# Patient Record
Sex: Female | Born: 1950 | Race: Black or African American | Hispanic: No | Marital: Married | Smoking: Never smoker
Health system: Southern US, Community
[De-identification: ages and names within clinical notes are randomized; demographics above are authoritative.]

## PROBLEM LIST (undated history)

## (undated) DIAGNOSIS — I10 Essential (primary) hypertension: Secondary | ICD-10-CM

## (undated) DIAGNOSIS — E7800 Pure hypercholesterolemia, unspecified: Secondary | ICD-10-CM

## (undated) HISTORY — PX: APPENDECTOMY: SHX54

## (undated) HISTORY — PX: CHOLECYSTECTOMY: SHX55

## (undated) HISTORY — PX: ABDOMINAL HYSTERECTOMY: SHX81

---

## 1998-09-22 ENCOUNTER — Emergency Department (HOSPITAL_COMMUNITY): Admission: EM | Admit: 1998-09-22 | Discharge: 1998-09-22 | Payer: Self-pay | Admitting: Emergency Medicine

## 1999-10-09 ENCOUNTER — Emergency Department (HOSPITAL_COMMUNITY): Admission: EM | Admit: 1999-10-09 | Discharge: 1999-10-09 | Payer: Self-pay | Admitting: Emergency Medicine

## 2001-06-21 ENCOUNTER — Emergency Department (HOSPITAL_COMMUNITY): Admission: EM | Admit: 2001-06-21 | Discharge: 2001-06-21 | Payer: Self-pay | Admitting: Emergency Medicine

## 2004-03-12 ENCOUNTER — Emergency Department (HOSPITAL_COMMUNITY): Admission: EM | Admit: 2004-03-12 | Discharge: 2004-03-12 | Payer: Self-pay | Admitting: Emergency Medicine

## 2008-05-09 ENCOUNTER — Emergency Department (HOSPITAL_COMMUNITY): Admission: EM | Admit: 2008-05-09 | Discharge: 2008-05-09 | Payer: Self-pay | Admitting: Emergency Medicine

## 2012-01-02 ENCOUNTER — Emergency Department (HOSPITAL_COMMUNITY)
Admission: EM | Admit: 2012-01-02 | Discharge: 2012-01-02 | Disposition: A | Discharge status: Home / Self Care | Payer: No Typology Code available for payment source | Attending: Emergency Medicine | Admitting: Emergency Medicine

## 2012-01-02 ENCOUNTER — Encounter (HOSPITAL_COMMUNITY): Payer: Self-pay | Admitting: *Deleted

## 2012-01-02 DIAGNOSIS — I10 Essential (primary) hypertension: Secondary | ICD-10-CM | POA: Insufficient documentation

## 2012-01-02 DIAGNOSIS — X19XXXA Contact with other heat and hot substances, initial encounter: Secondary | ICD-10-CM | POA: Insufficient documentation

## 2012-01-02 DIAGNOSIS — M25519 Pain in unspecified shoulder: Secondary | ICD-10-CM | POA: Insufficient documentation

## 2012-01-02 DIAGNOSIS — E119 Type 2 diabetes mellitus without complications: Secondary | ICD-10-CM | POA: Insufficient documentation

## 2012-01-02 DIAGNOSIS — M542 Cervicalgia: Secondary | ICD-10-CM | POA: Insufficient documentation

## 2012-01-02 DIAGNOSIS — T2002XA Burn of unspecified degree of lip(s), initial encounter: Secondary | ICD-10-CM | POA: Insufficient documentation

## 2012-01-02 DIAGNOSIS — M25511 Pain in right shoulder: Secondary | ICD-10-CM

## 2012-01-02 HISTORY — DX: Essential (primary) hypertension: I10

## 2012-01-02 HISTORY — DX: Pure hypercholesterolemia, unspecified: E78.00

## 2012-01-02 MED ORDER — BACITRACIN 500 UNIT/GM EX OINT
1.0000 "application " | TOPICAL_OINTMENT | Freq: Two times a day (BID) | CUTANEOUS | Status: DC
  Administered 2012-01-02: 1
  Filled 2012-01-02 (×3): qty 0.9

## 2012-01-02 MED ORDER — IBUPROFEN 800 MG PO TABS: 800.0000 mg | ORAL_TABLET | Freq: Three times a day (TID) | ORAL | Status: AC

## 2012-01-02 MED ORDER — METHOCARBAMOL 500 MG PO TABS: 500.0000 mg | ORAL_TABLET | Freq: Four times a day (QID) | ORAL | Status: AC | PRN
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
## 2012-01-02 NOTE — ED Provider Notes (Signed)
Medical screening examination/treatment/procedure(s) were performed by non-physician practitioner and as supervising physician I was immediately available for consultation/collaboration.  Marilyn Nihiser L Sloan Takagi, MD 01/02/12 2202 
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
## 2014-02-05 ENCOUNTER — Ambulatory Visit: Payer: Self-pay | Admitting: Dietician

## 2015-08-23 ENCOUNTER — Encounter: Payer: Self-pay | Admitting: Internal Medicine

## 2015-10-21 ENCOUNTER — Ambulatory Visit (AMBULATORY_SURGERY_CENTER): Payer: Self-pay

## 2015-10-21 ENCOUNTER — Telehealth: Payer: Self-pay

## 2015-10-21 ENCOUNTER — Encounter: Payer: Self-pay | Admitting: Internal Medicine

## 2015-10-21 ENCOUNTER — Other Ambulatory Visit: Payer: Self-pay

## 2015-10-21 VITALS — Ht 63.5 in | Wt 300.0 lb

## 2015-10-21 DIAGNOSIS — Z1211 Encounter for screening for malignant neoplasm of colon: Secondary | ICD-10-CM

## 2015-10-21 NOTE — Progress Notes (Signed)
No allergies to eggs or soy or flu shot No home oxygen No past problems with anesthesia No diet/weight loss meds  No internet

## 2015-10-21 NOTE — Telephone Encounter (Signed)
BMI too high

## 2015-10-21 NOTE — Telephone Encounter (Signed)
[facility] 11/02/15@10 :45am. Pt aware of appt.

## 2015-10-22 ENCOUNTER — Encounter (HOSPITAL_COMMUNITY): Payer: Self-pay | Admitting: *Deleted

## 2015-11-02 ENCOUNTER — Encounter (HOSPITAL_COMMUNITY)
Admission: RE | Disposition: A | Discharge status: Home / Self Care | Payer: Self-pay | Source: Ambulatory Visit | Attending: Internal Medicine

## 2015-11-02 ENCOUNTER — Encounter (HOSPITAL_COMMUNITY): Payer: Self-pay | Admitting: *Deleted

## 2015-11-02 ENCOUNTER — Ambulatory Visit (HOSPITAL_COMMUNITY): Payer: BC Managed Care – PPO | Admitting: Registered Nurse

## 2015-11-02 ENCOUNTER — Ambulatory Visit (HOSPITAL_COMMUNITY)
Admission: RE | Admit: 2015-11-02 | Discharge: 2015-11-02 | Disposition: A | Discharge status: Home / Self Care | Payer: BC Managed Care – PPO | Source: Ambulatory Visit | Attending: Internal Medicine | Admitting: Internal Medicine

## 2015-11-02 DIAGNOSIS — Z79899 Other long term (current) drug therapy: Secondary | ICD-10-CM | POA: Diagnosis not present

## 2015-11-02 DIAGNOSIS — Z794 Long term (current) use of insulin: Secondary | ICD-10-CM | POA: Insufficient documentation

## 2015-11-02 DIAGNOSIS — E119 Type 2 diabetes mellitus without complications: Secondary | ICD-10-CM | POA: Diagnosis not present

## 2015-11-02 DIAGNOSIS — K5730 Diverticulosis of large intestine without perforation or abscess without bleeding: Secondary | ICD-10-CM | POA: Diagnosis not present

## 2015-11-02 DIAGNOSIS — I10 Essential (primary) hypertension: Secondary | ICD-10-CM | POA: Diagnosis not present

## 2015-11-02 DIAGNOSIS — E669 Obesity, unspecified: Secondary | ICD-10-CM | POA: Diagnosis not present

## 2015-11-02 DIAGNOSIS — Z1211 Encounter for screening for malignant neoplasm of colon: Secondary | ICD-10-CM | POA: Diagnosis present

## 2015-11-02 DIAGNOSIS — E7800 Pure hypercholesterolemia, unspecified: Secondary | ICD-10-CM | POA: Diagnosis not present

## 2015-11-02 DIAGNOSIS — Z6841 Body Mass Index (BMI) 40.0 and over, adult: Secondary | ICD-10-CM | POA: Insufficient documentation

## 2015-11-02 HISTORY — PX: COLONOSCOPY: SHX5424

## 2015-11-02 LAB — GLUCOSE, CAPILLARY: GLUCOSE-CAPILLARY: 119 mg/dL — AB (ref 65–99)

## 2015-11-02 SURGERY — COLONOSCOPY
Anesthesia: Monitor Anesthesia Care

## 2015-11-02 MED ORDER — PROPOFOL 10 MG/ML IV BOLUS
INTRAVENOUS | Status: AC
  Filled 2015-11-02: qty 40

## 2015-11-02 MED ORDER — LIDOCAINE HCL (CARDIAC) 20 MG/ML IV SOLN
INTRAVENOUS | Status: AC
  Filled 2015-11-02: qty 5

## 2015-11-02 MED ORDER — SODIUM CHLORIDE 0.9 % IV SOLN: INTRAVENOUS | Status: DC

## 2015-11-02 MED ORDER — PROPOFOL 500 MG/50ML IV EMUL
INTRAVENOUS | Status: DC | PRN
  Administered 2015-11-02: 100 ug/kg/min via INTRAVENOUS

## 2015-11-02 MED ORDER — LACTATED RINGERS IV SOLN
INTRAVENOUS | Status: DC
  Administered 2015-11-02: 1000 mL via INTRAVENOUS

## 2015-11-02 MED ORDER — PROPOFOL 10 MG/ML IV BOLUS
INTRAVENOUS | Status: AC
  Filled 2015-11-02: qty 20

## 2015-11-02 NOTE — Discharge Instructions (Signed)

## 2015-11-02 NOTE — Op Note (Signed)
[facility] [address] [place], [id number]   COLONOSCOPY PROCEDURE REPORT  PATIENT: Denise Henry, Denise Henry  MR#: [id number] BIRTHDATE: 1951/08/19 , 64  yrs. old GENDER: female ENDOSCOPIST: Beverley FiedlerJay M Shaneta Cervenka, MD REFERRED BY: Selinda FlavinKevin Howard, MD PROCEDURE DATE:  11/02/2015 PROCEDURE:   Colonoscopy, screening First Screening Colonoscopy - Avg.  risk and is 50 yrs.  old or older Yes.  Prior Negative Screening - Now for repeat screening. N/A  History of Adenoma - Now for follow-up colonoscopy & has been > or = to 3 yrs.  N/A  Polyps removed today? No Recommend repeat exam, <10 yrs? No ASA CLASS:   Class III INDICATIONS:Screening for colonic neoplasia and Colorectal Neoplasm Risk Assessment for this procedure is average risk. MEDICATIONS: Monitored anesthesia care and Per Anesthesia  DESCRIPTION OF PROCEDURE:   After the risks benefits and alternatives of the procedure were thoroughly explained, informed consent was obtained.  The digital rectal exam revealed no rectal mass.   The Pentax Ped Colon [id number]  endoscope was introduced through the anus and advanced to the cecum, which was identified by both the appendix and ileocecal valve. No adverse events experienced.   The quality of the prep was good.  (Suprep was used) The instrument was then slowly withdrawn as the colon was fully examined. Estimated blood loss is zero unless otherwise noted in this procedure report.      COLON FINDINGS: There was mild diverticulosis noted in the sigmoid colon.   The examination was otherwise normal.  No polyps or tumors seen.   Retroflexed views revealed internal hemorrhoids. The time to cecum = 3.4 Withdrawal time = 13.1   The scope was withdrawn and the procedure completed.  COMPLICATIONS: There were no complications.  ENDOSCOPIC IMPRESSION: 1.   Mild diverticulosis was noted in the sigmoid colon 2.   The examination was otherwise normal  RECOMMENDATIONS: You should continue  to follow colorectal cancer screening guidelines for "routine risk" patients with a repeat colonoscopy in 10 years. There is no need for FOBT (stool) testing for at least 5 years.  eSigned:  Beverley FiedlerJay M Quayshawn Nin, MD 11/02/2015 11:21 AM   cc:  the patient, Dr. Dimas [facility]asHoward [facility]([place], [place])

## 2015-11-02 NOTE — Anesthesia Preprocedure Evaluation (Signed)
Anesthesia Evaluation  Patient identified by MRN, date of birth, ID band Patient awake    Reviewed: Allergy & Precautions, NPO status , Patient's Chart, lab work & pertinent test results  Airway Mallampati: II  TM Distance: >3 FB Neck ROM: Full    Dental no notable dental hx.    Pulmonary neg pulmonary ROS,    Pulmonary exam normal breath sounds clear to auscultation       Cardiovascular hypertension, Pt. on medications Normal cardiovascular exam Rhythm:Regular Rate:Normal     Neuro/Psych negative neurological ROS  negative psych ROS   GI/Hepatic negative GI ROS, Neg liver ROS,   Endo/Other  diabetes, Type 2, Insulin DependentMorbid obesity  Renal/GU negative Renal ROS     Musculoskeletal negative musculoskeletal ROS (+)   Abdominal (+) + obese,   Peds  Hematology negative hematology ROS (+)   Anesthesia Other Findings   Reproductive/Obstetrics negative OB ROS                             Anesthesia Physical Anesthesia Plan  ASA: III  Anesthesia Plan: MAC   Post-op Pain Management:    Induction: Intravenous  Airway Management Planned:   Additional Equipment:   Intra-op Plan:   Post-operative Plan:   Informed Consent: I have reviewed the patients History and Physical, chart, labs and discussed the procedure including the risks, benefits and alternatives for the proposed anesthesia with the patient or authorized representative who has indicated his/her understanding and acceptance.   Dental advisory given  Plan Discussed with: CRNA  Anesthesia Plan Comments:         Anesthesia Quick Evaluation

## 2015-11-02 NOTE — Anesthesia Postprocedure Evaluation (Signed)
Anesthesia Post Note  Patient: Denise Henry  Procedure(s) Performed: Procedure(s) (LRB): COLONOSCOPY (N/A)  Patient location during evaluation: PACU Anesthesia Type: MAC Level of consciousness: awake and alert Pain management: pain level controlled Vital Signs Assessment: post-procedure vital signs reviewed and stable Respiratory status: spontaneous breathing Cardiovascular status: stable Anesthetic complications: no    Last Vitals:  Filed Vitals:   11/02/15 1120 11/02/15 1130  BP: 115/52 126/49  Pulse: 89 86  Temp:    Resp: 21 14    Last Pain: There were no vitals filed for this visit.               Lewie LoronJohn Bing Duffey

## 2015-11-02 NOTE — Transfer of Care (Signed)
Immediate Anesthesia Transfer of Care Note  Patient: Denise Henry  Procedure(s) Performed: Procedure(s): COLONOSCOPY (N/A)  Patient Location: PACU and Endoscopy Unit  Anesthesia Type:MAC  Level of Consciousness: awake, alert , oriented and patient cooperative  Airway & Oxygen Therapy: Patient Spontanous Breathing and Patient connected to face mask oxygen  Post-op Assessment: Report given to RN, Post -op Vital signs reviewed and stable and Patient moving all extremities  Post vital signs: Reviewed and stable  Last Vitals:  Filed Vitals:   11/02/15 0931 11/02/15 1115  BP: 142/52   Pulse: 89   Temp: 36.6 C 36.6 C  Resp: 16     Complications: No apparent anesthesia complications

## 2015-11-02 NOTE — H&P (Signed)
  HPI: Denise Henry is a 64 year old female with history of diabetes, hypertension, hypercholesterolemia, obesity who presents for outpatient screening colonoscopy. She sees Dr. Selinda FlavinKevin Howard [place] She has never had a colonoscopy. Denies family history of colon cancer. Denies change in bowel habit. No rectal bleeding or melena. No abdominal pain.  Past Medical History  Diagnosis Date  . Diabetes mellitus   . Hypertension   . Hypercholesteremia     Past Surgical History  Procedure Laterality Date  . Appendectomy    . Abdominal hysterectomy    . Cholecystectomy       No Known Allergies  Family History  Problem Relation Age of Onset  . Colon cancer Neg Hx     Social History  Substance Use Topics  . Smoking status: Never Smoker   . Smokeless tobacco: None  . Alcohol Use: No    ROS: As per history of present illness, otherwise negative  BP 142/52 mmHg  Pulse 89  Temp(Src) 97.9 F (36.6 C) (Oral)  Resp 16  Ht 5\' 3"  (1.6 m)  Wt 300 lb (136.079 kg)  BMI 53.16 kg/m2  SpO2 100% Gen: awake, alert, NAD HEENT: anicteric, op clear CV: RRR, no mrg Pulm: CTA b/l Abd: soft, obese, NT/ND, +BS throughout Ext: no c/c/e Neuro: nonfocal  ASSESSMENT/PLAN: 64 year old female with history of diabetes, hypertension, hypercholesterolemia, obesity who presents for outpatient screening colonoscopy.   1. CRC screening -- average risk.  1st colonoscopy.  The nature of the procedure, as well as the risks, benefits, and alternatives were carefully and thoroughly reviewed with the patient. Ample time for discussion and questions allowed. The patient understood, was satisfied, and agreed to proceed.

## 2015-11-03 ENCOUNTER — Encounter (HOSPITAL_COMMUNITY): Payer: Self-pay | Admitting: Internal Medicine

## 2015-11-04 ENCOUNTER — Encounter: Payer: Self-pay | Admitting: Internal Medicine

## 2016-04-10 ENCOUNTER — Other Ambulatory Visit: Payer: Self-pay | Admitting: Family Medicine

## 2016-04-10 DIAGNOSIS — Z1231 Encounter for screening mammogram for malignant neoplasm of breast: Secondary | ICD-10-CM

## 2016-04-17 ENCOUNTER — Ambulatory Visit: Payer: BC Managed Care – PPO

## 2017-04-27 ENCOUNTER — Ambulatory Visit: Payer: BC Managed Care – PPO

## 2019-07-19 ENCOUNTER — Encounter (HOSPITAL_COMMUNITY): Payer: Self-pay

## 2019-07-19 ENCOUNTER — Ambulatory Visit (HOSPITAL_COMMUNITY)
Admission: EM | Admit: 2019-07-19 | Discharge: 2019-07-19 | Disposition: A | Discharge status: Home / Self Care | Payer: BC Managed Care – PPO | Attending: Emergency Medicine | Admitting: Emergency Medicine

## 2019-07-19 ENCOUNTER — Other Ambulatory Visit: Payer: Self-pay

## 2019-07-19 DIAGNOSIS — M5432 Sciatica, left side: Secondary | ICD-10-CM | POA: Diagnosis not present

## 2019-07-19 MED ORDER — METHOCARBAMOL 500 MG PO TABS: 500.0000 mg | ORAL_TABLET | Freq: Two times a day (BID) | ORAL | 0 refills | Status: DC

## 2019-07-19 MED ORDER — KETOROLAC TROMETHAMINE 30 MG/ML IJ SOLN
INTRAMUSCULAR | Status: AC
Start: 2019-07-19 — End: ?
  Filled 2019-07-19: qty 1

## 2019-07-19 MED ORDER — KETOROLAC TROMETHAMINE 30 MG/ML IJ SOLN
30.0000 mg | Freq: Once | INTRAMUSCULAR | Status: AC
  Administered 2019-07-19: 30 mg via INTRAMUSCULAR

## 2019-07-19 NOTE — ED Triage Notes (Signed)
Pt presents with left side hip pain that radiates down to thigh X 4 days.

## 2019-07-19 NOTE — ED Provider Notes (Signed)
[facility]    CSN: [id number] Arrival date & time: 07/19/19  1333      History   Chief Complaint Chief Complaint  Patient presents with   Hip Pain    HPI Denise Henry is a 68 y.o. female.   Patient presents with 4-day history of left hip pain, which radiates down her left posterior thigh.  She denies falls or injury.  She has been treating her pain at home with Tylenol and a heating pad with minimal relief.  She states the pain is worse in the morning and improves as the day goes by.  She denies saddle anesthesia, bowel/bladder incontinence, weakness, paresthesias, or other symptoms.      The history is provided by the patient.    Past Medical History:  Diagnosis Date   Diabetes mellitus    Hypercholesteremia    Hypertension     Patient Active Problem List   Diagnosis Date Noted   Screening for colon cancer     Past Surgical History:  Procedure Laterality Date   ABDOMINAL HYSTERECTOMY     APPENDECTOMY     CHOLECYSTECTOMY     COLONOSCOPY N/A 11/02/2015   Procedure: COLONOSCOPY;  Surgeon: Beverley FiedlerJay M Pyrtle, MD;  Location: [facility];  Service: Gastroenterology;  Laterality: N/A;    OB History   No obstetric history on file.      Home Medications    Prior to Admission medications   Medication Sig Start Date End Date Taking? Authorizing Provider  aspirin EC 81 MG tablet Take 81 mg by mouth daily.    [provider]  atorvastatin (LIPITOR) 20 MG tablet Take 10 mg by mouth at bedtime. Takes 1/2 tab 10 mg    [provider]  glyBURIDE (DIABETA) 5 MG tablet Take 10 mg by mouth 2 (two) times daily with a meal.    [provider]  losartan-hydrochlorothiazide (HYZAAR) 100-25 MG per tablet Take 1 tablet by mouth daily.    [provider]  metFORMIN (GLUCOPHAGE) 1000 MG tablet Take 1,000 mg by mouth 2 (two) times daily with a meal.    [provider]  methocarbamol (ROBAXIN) 500 MG tablet Take 1 tablet  (500 mg total) by mouth 2 (two) times daily. 07/19/19   Mickie Bailate, Jase Himmelberger H, NP  Multiple Vitamin (MULITIVITAMIN WITH MINERALS) TABS Take 1 tablet by mouth daily.    [provider]  pioglitazone (ACTOS) 45 MG tablet Take 45 mg by mouth at bedtime.    [provider]  VICTOZA 18 MG/3ML SOPN Inject 1.2 mg into the skin daily. 10/14/15   [provider]    Family History Family History  Problem Relation Age of Onset   Colon cancer Neg Hx     Social History Social History   Tobacco Use   Smoking status: Never Smoker   Smokeless tobacco: Never Used  Substance Use Topics   Alcohol use: No   Drug use: No     Allergies   Patient has no known allergies.   Review of Systems Review of Systems  Constitutional: Negative for chills and fever.  HENT: Negative for ear pain and sore throat.   Eyes: Negative for pain and visual disturbance.  Respiratory: Negative for cough and shortness of breath.   Cardiovascular: Negative for chest pain and palpitations.  Gastrointestinal: Negative for abdominal pain and vomiting.  Genitourinary: Negative for dysuria and hematuria.  Musculoskeletal: Positive for arthralgias. Negative for back pain.  Skin: Negative for color change and  rash.  Neurological: Negative for seizures and syncope.  All other systems reviewed and are negative.    Physical Exam Triage Vital Signs ED Triage Vitals  Enc Vitals Group     BP 07/19/19 1433 (!) 152/68     Pulse Rate 07/19/19 1433 99     Resp 07/19/19 1433 17     Temp 07/19/19 1433 98.9 F (37.2 C)     Temp Source 07/19/19 1433 Oral     SpO2 07/19/19 1433 95 %     Weight --      Height --      Head Circumference --      Peak Flow --      Pain Score 07/19/19 1434 7     Pain Loc --      Pain Edu? --      Excl. in GC? --    No data found.  Updated Vital Signs BP (!) 152/68 (BP Location: Right Arm)    Pulse 99    Temp 98.9 F (37.2 C) (Oral)    Resp 17    SpO2 95%   Visual  Acuity Right Eye Distance:   Left Eye Distance:   Bilateral Distance:    Right Eye Near:   Left Eye Near:    Bilateral Near:     Physical Exam Vitals signs and nursing note reviewed.  Constitutional:      General: She is not in acute distress.    Appearance: She is well-developed. She is not ill-appearing.  HENT:     Head: Normocephalic and atraumatic.     Mouth/Throat:     Mouth: Mucous membranes are moist.  Eyes:     Conjunctiva/sclera: Conjunctivae normal.  Neck:     Musculoskeletal: Neck supple.  Cardiovascular:     Rate and Rhythm: Normal rate and regular rhythm.     Heart sounds: No murmur.  Pulmonary:     Effort: Pulmonary effort is normal. No respiratory distress.     Breath sounds: Normal breath sounds.  Abdominal:     General: Bowel sounds are normal.     Palpations: Abdomen is soft.     Tenderness: There is no abdominal tenderness. There is no right CVA tenderness, left CVA tenderness, guarding or rebound.  Musculoskeletal: Normal range of motion.        General: No swelling, tenderness, deformity or signs of injury.  Skin:    General: Skin is warm and dry.     Capillary Refill: Capillary refill takes less than 2 seconds.     Findings: No bruising, erythema, lesion or rash.  Neurological:     General: No focal deficit present.     Mental Status: She is alert and oriented to person, place, and time.     Sensory: No sensory deficit.     Motor: No weakness.     Coordination: Coordination normal.     Gait: Gait normal.     Deep Tendon Reflexes: Reflexes normal.  Psychiatric:        Mood and Affect: Mood normal.        Behavior: Behavior normal.      UC Treatments / Results  Labs (all labs ordered are listed, but only abnormal results are displayed) Labs Reviewed - No data to display  EKG   Radiology No results found.  Procedures Procedures (including critical care time)  Medications Ordered in UC Medications  ketorolac (TORADOL) 30 MG/ML  injection 30 mg (has no administration in time range)  ketorolac (TORADOL) 30 MG/ML injection (has no administration in time range)    Initial Impression / Assessment and Plan / UC Course  I have reviewed the triage vital signs and the nursing notes.  Pertinent labs & imaging results that were available during my care of the patient were reviewed by me and considered in my medical decision making (see chart for details).    Sciatica of left side.  Treating with injection of Toradol and prescription for Robaxin.  Precautions for Robaxin discussed at length with patient; instructed her not to drive, operate machinery, or drink alcohol with this medication as it may cause drowsiness.  Instructed patient to follow-up with her PCP if her pain persists.  Instructed her to return here or go to the ED if she has worsening pain, weakness, paresthesias, or other concerning symptoms.  Patient agrees with plan of care.     Final Clinical Impressions(s) / UC Diagnoses   Final diagnoses:  Sciatica of left side     Discharge Instructions     You were given an injection of a pain medication called Toradol.    Take the muscle relaxer Robaxin as prescribed.  Do not drive, operate machinery, or drink alcohol with this medication as it may cause drowsiness.    Follow-up with your primary care provider if your pain continues.    Return here or go to the emergency department if you have worsening pain, or develop new symptoms such as weakness, numbness, tingling, or other concerns.        ED Prescriptions    Medication Sig Dispense Auth. Provider   methocarbamol (ROBAXIN) 500 MG tablet Take 1 tablet (500 mg total) by mouth 2 (two) times daily. 20 tablet Sharion Balloon, NP     Controlled Substance Prescriptions [place] Controlled Substance Registry consulted? Not Applicable   Sharion Balloon, NP 07/19/19 1537

## 2019-07-19 NOTE — Discharge Instructions (Signed)
You were given an injection of a pain medication called Toradol.    Take the muscle relaxer Robaxin as prescribed.  Do not drive, operate machinery, or drink alcohol with this medication as it may cause drowsiness.    Follow-up with your primary care provider if your pain continues.    Return here or go to the emergency department if you have worsening pain, or develop new symptoms such as weakness, numbness, tingling, or other concerns.

## 2020-01-31 ENCOUNTER — Ambulatory Visit (HOSPITAL_COMMUNITY)
Admission: EM | Admit: 2020-01-31 | Discharge: 2020-01-31 | Disposition: A | Discharge status: Home / Self Care | Payer: BC Managed Care – PPO | Attending: Family Medicine | Admitting: Family Medicine

## 2020-01-31 ENCOUNTER — Other Ambulatory Visit: Payer: Self-pay

## 2020-01-31 ENCOUNTER — Encounter (HOSPITAL_COMMUNITY): Payer: Self-pay

## 2020-01-31 DIAGNOSIS — S1190XA Unspecified open wound of unspecified part of neck, initial encounter: Secondary | ICD-10-CM | POA: Diagnosis not present

## 2020-01-31 DIAGNOSIS — E1169 Type 2 diabetes mellitus with other specified complication: Secondary | ICD-10-CM

## 2020-01-31 MED ORDER — DOXYCYCLINE HYCLATE 100 MG PO CAPS: 100.0000 mg | ORAL_CAPSULE | Freq: Two times a day (BID) | ORAL | 0 refills | Status: AC

## 2020-01-31 NOTE — ED Provider Notes (Signed)
[facility]   [id number] 01/31/20 Arrival Time: 4097  ASSESSMENT & PLAN:  1. Open neck wound, initial encounter   2. Type 2 diabetes mellitus with other specified complication, without long-term current use of insulin (HCC)    ' Diabetic; reports blood sugars are controlled. Stressed importance of wound care. Likely ruptured epidermoid cyst. Stop Neosporin; localized skin reaction present. No abscess present.  Begin: Meds ordered this encounter  Medications  . doxycycline (VIBRAMYCIN) 100 MG capsule    Sig: Take 1 capsule (100 mg total) by mouth 2 (two) times daily.    Dispense:  14 capsule    Refill:  0    My eventually need wound care specialist but prefers to see her PCP first.  Follow-up Information    Rory Percy, MD.   Specialty: Family Medicine Why: Keep your appointment next week. Contact information: [address] [place] [address] [phone]           Reviewed expectations re: course of current medical issues. Questions answered. Outlined signs and symptoms indicating need for more acute intervention. Understanding verbalized. After Visit Summary given.   SUBJECTIVE: History from: patient. Denise Henry is a 69 y.o. female who requests a wound evaluation; posterior neck. Reports having "a bump or a pimple there for the past year" that ruptured approx one week ago. Has been applying Neosporin; some new skin redness noted. Afebrile. No significant associated pain reported. Otherwise well.  OBJECTIVE:  Vitals:   01/31/20 1254 01/31/20 1255  BP: 129/87   Pulse: 98   Resp: 16   Temp: 97.7 F (36.5 C)   TempSrc: Oral   SpO2: 98%   Weight:  108.9 kg  Height:  5\' 4"  (1.626 m)    General appearance: alert; no distress Eyes: PERRLA; EOMI; conjunctiva normal Neck: supple with FROM; no LAD Lungs: speaks full sentences without difficulty; unlabored Extremities: no edema Skin: warm and dry; posterior neck with approx 0.5 x 1 cm open  wound with slight inferior beginnings of skin ulceration; granular tissue present; no active drainage or bleeding; rectangular shape of erythematous/darkend skin present around wound (same size and shape as bandage covering wound) Neurologic: normal gait Psychological: alert and cooperative; normal mood and affect   No Known Allergies  Past Medical History:  Diagnosis Date  . Diabetes mellitus   . Hypercholesteremia   . Hypertension    Social History   Socioeconomic History  . Marital status: Married    Spouse name: Not on file  . Number of children: Not on file  . Years of education: Not on file  . Highest education level: Not on file  Occupational History  . Not on file  Tobacco Use  . Smoking status: Never Smoker  . Smokeless tobacco: Never Used  Substance and Sexual Activity  . Alcohol use: No  . Drug use: No  . Sexual activity: Not on file  Other Topics Concern  . Not on file  Social History Narrative  . Not on file   Social Determinants of Health   Financial Resource Strain:   . Difficulty of Paying Living Expenses:   Food Insecurity:   . Worried About Charity fundraiser in the Last Year:   . Arboriculturist in the Last Year:   Transportation Needs:   . Film/video editor (Medical):   Marland Kitchen Lack of Transportation (Non-Medical):   Physical Activity:   . Days of Exercise per Week:   . Minutes of Exercise per  Session:   Stress:   . Feeling of Stress :   Social Connections:   . Frequency of Communication with Friends and Family:   . Frequency of Social Gatherings with Friends and Family:   . Attends Religious Services:   . Active Member of Clubs or Organizations:   . Attends Banker Meetings:   Marland Kitchen Marital Status:   Intimate Partner Violence:   . Fear of Current or Ex-Partner:   . Emotionally Abused:   Marland Kitchen Physically Abused:   . Sexually Abused:    Family History  Problem Relation Age of Onset  . Colon cancer Neg Hx    Past Surgical  History:  Procedure Laterality Date  . ABDOMINAL HYSTERECTOMY    . APPENDECTOMY    . CHOLECYSTECTOMY    . COLONOSCOPY N/A 11/02/2015   Procedure: COLONOSCOPY;  Surgeon: Beverley Fiedler, MD;  Location: [facility];  Service: Gastroenterology;  Laterality: N/A;     Mardella Layman, MD 01/31/20 1440

## 2020-01-31 NOTE — ED Triage Notes (Signed)
Pt states she had a pimple on her back that has been there for a year and last Sunday the pimple popped and husband wanted her to get it looked at. The wound is on the back of the pt's neck. The wound is 2cmx2cm and 3cm deep with tunneling. The surrounding skin is erythematous. The wound bed is pink.

## 2020-05-25 ENCOUNTER — Other Ambulatory Visit: Payer: Self-pay | Admitting: Family Medicine

## 2020-05-25 DIAGNOSIS — E2839 Other primary ovarian failure: Secondary | ICD-10-CM

## 2020-05-25 DIAGNOSIS — Z1231 Encounter for screening mammogram for malignant neoplasm of breast: Secondary | ICD-10-CM

## 2020-07-14 ENCOUNTER — Ambulatory Visit (HOSPITAL_COMMUNITY)
Admission: RE | Admit: 2020-07-14 | Discharge: 2020-07-14 | Disposition: A | Discharge status: Home / Self Care | Payer: BC Managed Care – PPO | Source: Ambulatory Visit | Attending: Pulmonary Disease | Admitting: Pulmonary Disease

## 2020-07-14 ENCOUNTER — Other Ambulatory Visit: Payer: Self-pay | Admitting: Oncology

## 2020-07-14 DIAGNOSIS — U071 COVID-19: Secondary | ICD-10-CM

## 2020-07-14 MED ORDER — ALBUTEROL SULFATE HFA 108 (90 BASE) MCG/ACT IN AERS: 2.0000 | INHALATION_SPRAY | Freq: Once | RESPIRATORY_TRACT | Status: DC | PRN

## 2020-07-14 MED ORDER — METHYLPREDNISOLONE SODIUM SUCC 125 MG IJ SOLR: 125.0000 mg | Freq: Once | INTRAMUSCULAR | Status: DC | PRN

## 2020-07-14 MED ORDER — CASIRIVIMAB-IMDEVIMAB 600-600 MG/10ML IJ SOLN
1200.0000 mg | Freq: Once | INTRAMUSCULAR | Status: AC
  Administered 2020-07-14: 1200 mg via INTRAVENOUS
  Filled 2020-07-14: qty 10

## 2020-07-14 MED ORDER — EPINEPHRINE 0.3 MG/0.3ML IJ SOAJ: 0.3000 mg | Freq: Once | INTRAMUSCULAR | Status: DC | PRN

## 2020-07-14 MED ORDER — DIPHENHYDRAMINE HCL 50 MG/ML IJ SOLN: 50.0000 mg | Freq: Once | INTRAMUSCULAR | Status: DC | PRN

## 2020-07-14 MED ORDER — FAMOTIDINE IN NACL 20-0.9 MG/50ML-% IV SOLN: 20.0000 mg | Freq: Once | INTRAVENOUS | Status: DC | PRN

## 2020-07-14 MED ORDER — ACETAMINOPHEN 325 MG PO TABS
650.0000 mg | ORAL_TABLET | Freq: Four times a day (QID) | ORAL | Status: DC | PRN
  Administered 2020-07-14: 650 mg via ORAL
  Filled 2020-07-14: qty 2

## 2020-07-14 MED ORDER — SODIUM CHLORIDE 0.9 % IV SOLN: INTRAVENOUS | Status: DC | PRN

## 2020-07-14 NOTE — Progress Notes (Signed)
I connected by phone with  Mrs. Woodlief to discuss the potential use of an new treatment for mild to moderate COVID-19 viral infection in non-hospitalized patients.   This patient is a age/sex that meets the FDA criteria for Emergency Use Authorization of casirivimab\imdevimab.  Has a (+) direct SARS-CoV-2 viral test result 1. Has mild or moderate COVID-19  2. Is ? 69 years of age and weighs ? 40 kg 3. Is NOT hospitalized due to COVID-19 4. Is NOT requiring oxygen therapy or requiring an increase in baseline oxygen flow rate due to COVID-19 5. Is within 10 days of symptom onset 6. Has at least one of the high risk factor(s) for progression to severe COVID-19 and/or hospitalization as defined in EUA. ? Specific high risk criteria :Age, DM   Symptom onset  07/07/20   I have spoken and communicated the following to the patient or parent/caregiver:   1. FDA has authorized the emergency use of casirivimab\imdevimab for the treatment of mild to moderate COVID-19 in adults and pediatric patients with positive results of direct SARS-CoV-2 viral testing who are 27 years of age and older weighing at least 40 kg, and who are at high risk for progressing to severe COVID-19 and/or hospitalization.   2. The significant known and potential risks and benefits of casirivimab\imdevimab, and the extent to which such potential risks and benefits are unknown.   3. Information on available alternative treatments and the risks and benefits of those alternatives, including clinical trials.   4. Patients treated with casirivimab\imdevimab should continue to self-isolate and use infection control measures (e.g., wear mask, isolate, social distance, avoid sharing personal items, clean and disinfect "high touch" surfaces, and frequent handwashing) according to CDC guidelines.    5. The patient or parent/caregiver has the option to accept or refuse casirivimab\imdevimab .   After reviewing this information with the  patient, The patient agreed to proceed with receiving casirivimab\imdevimab infusion and will be provided a copy of the Fact sheet prior to receiving the infusion.Mignon Pine, AGNP-C [phone] (Infusion Center Hotline)

## 2020-07-14 NOTE — Discharge Instructions (Signed)

## 2020-08-19 ENCOUNTER — Other Ambulatory Visit: Payer: BC Managed Care – PPO

## 2020-08-19 ENCOUNTER — Ambulatory Visit: Payer: BC Managed Care – PPO

## 2020-09-06 ENCOUNTER — Ambulatory Visit: Payer: Self-pay | Admitting: Surgery

## 2020-09-06 NOTE — H&P (Signed)
Denise Henry Appointment: 09/06/2020 3:45 PM Location: [facility] Patient #: [phone] DOB: 07/24/1951 Married / Language: English / Race: Black or African American Female  History of Present Illness Denise Sportsman MD; 09/06/2020 4:26 PM) The patient is a 69 year old female who presents with an epidermal cyst. Note for "Epidermal cyst": ` ` ` The patient returns      The patient returns to clinic after being evaluated for ruptured epidermal cyst on the back of her neck May 2021. We avoided any aggressive debridement and followed it with her. It closed down. I offered follow-up in a few weeks to make sure it healed okay. I have not seen her in 3 months. She did usual follow-up with her primary care physician. She recalls being told that there were some sinuses and possible retained cyst. Primary care physician wished surgery to reevaluate to consider excision. She wish to avoid any surgery but was still concerned. She decided to come in to have me take a look at it again. She does note that she has had some episodes of discomfort if she lies on it. However no severe sharp pain. She's not had any drainage. She's not need any antibiotics. She feels like it is better but it is not totally flat.  Some discomfort when she turns her twists or heads. Has not drained.    Marland Kitchen   ` `   Problem List/Past Medical Denise Sportsman, MD; 09/06/2020 4:10 PM) RUPTURED EPIDERMAL CYST (L72.0)  Past Surgical History Denise Sportsman, MD; 09/06/2020 4:10 PM) Gallbladder Surgery - Laparoscopic  Diagnostic Studies History Denise Sportsman, MD; 09/06/2020 4:10 PM) Colonoscopy 5-10 years ago Mammogram 1-3 years ago Pap Smear 1-5 years ago  Allergies (Chanel Lonni Fix, CMA; 09/06/2020 3:47 PM) No Known Drug Allergies [03/01/2020]: Allergies Reconciled  Medication History (Chanel Lonni Fix, CMA; 09/06/2020 3:47 PM) Aspirin (81MG  Tablet, Oral) Active. Atorvastatin  Calcium (20MG  Tablet, Oral) Active. Gabapentin (300MG  Capsule, Oral) Active. glyBURIDE (5MG  Tablet, Oral) Active. hydroCHLOROthiazide (25MG  Tablet, Oral) Active. Losartan Potassium-HCTZ (100-25MG  Tablet, Oral) Active. metFORMIN HCl (1000MG  Tablet, Oral) Active. Victoza (18MG /3ML Soln Pen-inj, Subcutaneous) Active. Medications Reconciled  Social History , MD; 09/06/2020 4:10 PM) Caffeine use Carbonated beverages, Coffee. No alcohol use No drug use Tobacco use Never smoker.  Family History , MD; 09/06/2020 4:10 PM) Breast Cancer Family Members In General. Diabetes Mellitus Brother, Father, Mother. Hypertension Father, Mother.  Pregnancy / Birth History , MD; 09/06/2020 4:10 PM) Age at menarche 15 years. Age of menopause <47 Gravida 1 Maternal age 46-35 Para 1  Other Problems Denise Sportsman, MD; 09/06/2020 4:10 PM) Diabetes Mellitus High blood pressure Oophorectomy Bilateral.    Vitals (Chanel Nolan CMA; 09/06/2020 3:47 PM) 09/06/2020 3:47 PM Weight: 248.5 lb Height: 63in Body Surface Area: 2.12 m Body Mass Index: 44.02 kg/m  Temp.: 97.80F  Pulse: 116 (Regular)         Physical Exam Denise Sportsman MD; 09/06/2020 4:25 PM)  General Mental Status-Alert. General Appearance-Not in acute distress, Not Sickly. Orientation-Oriented X3. Hydration-Well hydrated. Voice-Normal. Note: Nontoxic. Not sickly. Not crying today  Integumentary Global Assessment Upon inspection and palpation of skin surfaces of the - Axillae: non-tender, no inflammation or ulceration, no drainage. and Distribution of scalp and body hair is normal. General Characteristics Temperature - normal warmth is noted.  Head and Neck Head-normocephalic, atraumatic with no lesions or palpable masses. Face Global Assessment - atraumatic, no absence of expression. Neck Global  Assessment - no abnormal  movements, no bruit auscultated on the right, no bruit auscultated on the left, no decreased range of motion, non-tender. Trachea-midline. Thyroid Gland Characteristics - non-tender. Note: Posterior neck mass with scar and sensitivity. Consistent with persistent/recurrent sebaceous cyst. No active cellulitis or drainage at this point  Eye Eyeball - Left-Extraocular movements intact, No Nystagmus - Left. Eyeball - Right-Extraocular movements intact, No Nystagmus - Right. Cornea - Left-No Hazy - Left. Cornea - Right-No Hazy - Right. Sclera/Conjunctiva - Left-No scleral icterus, No Discharge - Left. Sclera/Conjunctiva - Right-No scleral icterus, No Discharge - Right. Pupil - Left-Direct reaction to light normal. Pupil - Right-Direct reaction to light normal.  ENMT Ears Pinna - Left - no drainage observed, no generalized tenderness observed. Pinna - Right - no drainage observed, no generalized tenderness observed. Nose and Sinuses External Inspection of the Nose - no destructive lesion observed. Inspection of the nares - Left - quiet respiration. Inspection of the nares - Right - quiet respiration. Mouth and Throat Lips - Upper Lip - no fissures observed, no pallor noted. Lower Lip - no fissures observed, no pallor noted. Nasopharynx - no discharge present. Oral Cavity/Oropharynx - Tongue - no dryness observed. Oral Mucosa - no cyanosis observed. Hypopharynx - no evidence of airway distress observed.  Chest and Lung Exam Inspection Movements - Normal and Symmetrical. Accessory muscles - No use of accessory muscles in breathing. Palpation Palpation of the chest reveals - Non-tender. Auscultation Breath sounds - Normal and Clear.  Cardiovascular Auscultation Rhythm - Regular. Murmurs & Other Heart Sounds - Auscultation of the heart reveals - No Murmurs and No Systolic Clicks.  Abdomen Inspection Inspection of the abdomen reveals - No Visible peristalsis and No  Abnormal pulsations. Umbilicus - No Bleeding, No Urine drainage. Palpation/Percussion Palpation and Percussion of the abdomen reveal - Soft, Non Tender, No Rebound tenderness, No Rigidity (guarding) and No Cutaneous hyperesthesia. Note: Abdomen soft. Not severely distended. No distasis recti. No umbilical or other anterior abdominal wall hernias  Female Genitourinary Sexual Maturity Tanner 5 - Adult hair pattern. Note: No vaginal bleeding nor discharge  Peripheral Vascular Upper Extremity Inspection - Left - No Cyanotic nailbeds - Left, Not Ischemic. Inspection - Right - No Cyanotic nailbeds - Right, Not Ischemic.  Neurologic Neurologic evaluation reveals -normal attention span and ability to concentrate, able to name objects and repeat phrases. Appropriate fund of knowledge , normal sensation and normal coordination. Mental Status Affect - not angry, not paranoid. Cranial Nerves-Normal Bilaterally. Gait-Normal.  Neuropsychiatric Mental status exam performed with findings of-able to articulate well with normal speech/language, rate, volume and coherence, thought content normal with ability to perform basic computations and apply abstract reasoning and no evidence of hallucinations, delusions, obsessions or homicidal/suicidal ideation. Note: Mildly anxious but consolable.  Musculoskeletal Global Assessment Spine, Ribs and Pelvis - no instability, subluxation or laxity. Right Upper Extremity - no instability, subluxation or laxity.  Lymphatic Head & Neck General Head & Neck Lymphatics: Bilateral - Description - No Localized lymphadenopathy. Axillary General Axillary Region: Bilateral - Description - No Localized lymphadenopathy. Femoral & Inguinal Generalized Femoral & Inguinal Lymphatics: Left - Description - No Localized lymphadenopathy. Right - Description - No Localized lymphadenopathy.    Assessment & Plan Denise Sportsman MD; 09/06/2020 4:25 PM)  RUPTURED  EPIDERMAL CYST (L72.0) Impression: Spontaneously ruptured epidermal cyst on posterior neck treated with packing wound care and antibiotics.  She has a recurrent/persistent cyst on her neck. Occasionally bothersome. I recommended excision to remove the  remaining cystic tissue. Should be outpatient surgery with some local improbable sedation. She wondered about observing further. I did not think delaying would be of much use at this point. She does get discomfort with it. I'm worried that it is going to get infected again. Recommend she consider surgery. She seemed indecisive at first, she is interested in proceeding with surgery in a few months after the holidays  Current Plans The pathophysiology of skin & subcutaneous masses was discussed. Natural history risks without surgery were discussed. I recommended surgery to remove the mass. I explained the technique of removal with use of local anesthesia & possible need for more aggressive sedation/anesthesia for patient comfort.  Risks such as bleeding, infection, wound breakdown, heart attack, death, and other risks were discussed. I noted a good likelihood this will help address the problem. Possibility that this will not correct all symptoms was explained. Possibility of regrowth/recurrence of the mass was discussed. We will work to minimize complications. Questions were answered. The patient expresses understanding & wishes to proceed with surgery.  You are being scheduled for surgery- Our schedulers will call you.  You should hear from our office's scheduling department within 5 working days about the location, date, and time of surgery. We try to make accommodations for patient's preferences in scheduling surgery, but sometimes the OR schedule or the surgeon's schedule prevents [place] from making those accommodations.  If you have not heard from our office [phone]) in 5 working days, call the office and ask for your surgeon's  nurse.  If you have other questions about your diagnosis, plan, or surgery, call the office and ask for your surgeon's nurse.  Denise Sportsman, MD, FACS, MASCRS Gastrointestinal and Minimally Invasive Surgery  [facility] [address] [place], [place] [address] [phone] Fax [phone] Main/Paging  CONTACT INFORMATION: Weekday (9AM-5PM) concerns: Call [facility] main office at [phone] Weeknight (5PM-9AM) or Weekend/Holiday concerns: Check [email or web address] for General Surgery [facility] coverage (Please, do not use SecureChat as it is not reliable communication to operating surgeons for immediate patient care)

## 2020-11-03 ENCOUNTER — Ambulatory Visit: Payer: BC Managed Care – PPO

## 2020-11-03 ENCOUNTER — Other Ambulatory Visit: Payer: BC Managed Care – PPO

## 2021-02-01 ENCOUNTER — Other Ambulatory Visit: Payer: Self-pay | Admitting: Family Medicine

## 2021-02-01 DIAGNOSIS — Z13820 Encounter for screening for osteoporosis: Secondary | ICD-10-CM

## 2021-02-04 ENCOUNTER — Other Ambulatory Visit: Payer: Self-pay

## 2021-02-04 ENCOUNTER — Ambulatory Visit
Admission: RE | Admit: 2021-02-04 | Discharge: 2021-02-04 | Disposition: A | Discharge status: Home / Self Care | Payer: BC Managed Care – PPO | Source: Ambulatory Visit | Attending: Family Medicine | Admitting: Family Medicine

## 2021-02-04 DIAGNOSIS — Z13820 Encounter for screening for osteoporosis: Secondary | ICD-10-CM

## 2021-02-04 DIAGNOSIS — Z1231 Encounter for screening mammogram for malignant neoplasm of breast: Secondary | ICD-10-CM

## 2021-08-24 IMAGING — MG DIGITAL SCREENING BILAT W/ CAD
7 series · 7 of 7 positions shown · non-contrast
Comparison: Previous exam(s).

CLINICAL DATA: Screening.

EXAM:
DIGITAL SCREENING BILATERAL MAMMOGRAM WITH CAD
TECHNIQUE: Bilateral screening digital craniocaudal and mediolateral oblique
mammograms were obtained. The images were evaluated with
computer-aided detection.

[R MLO]
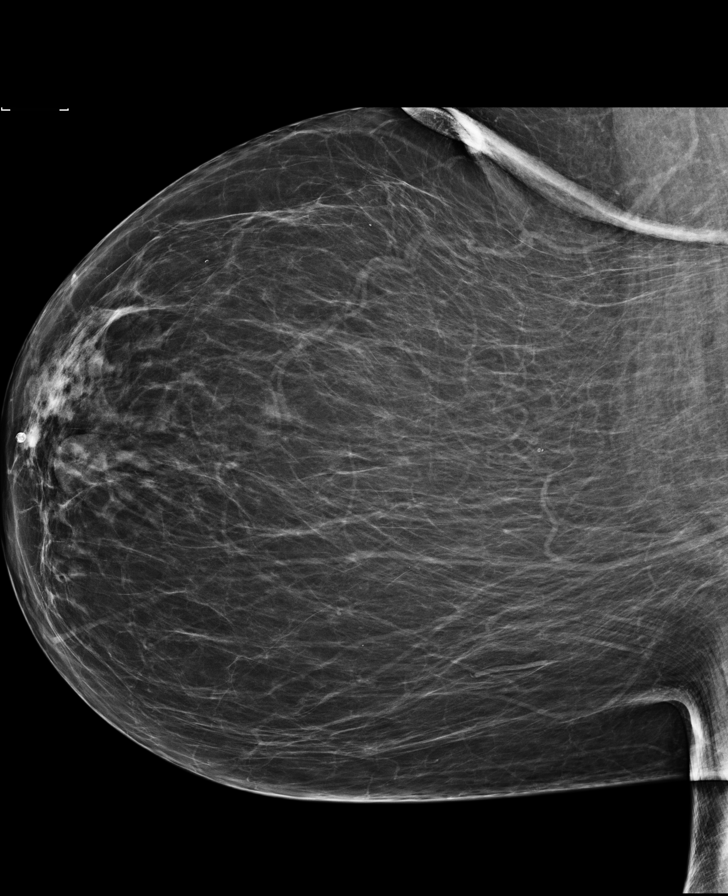

[L CC (1 of 2)]
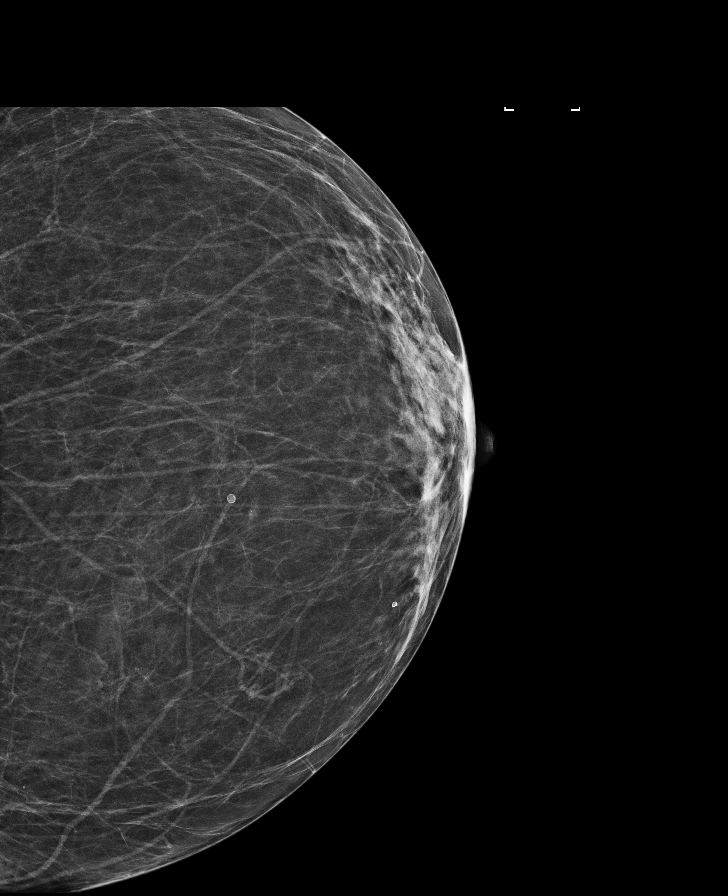

[L CC (2 of 2)]
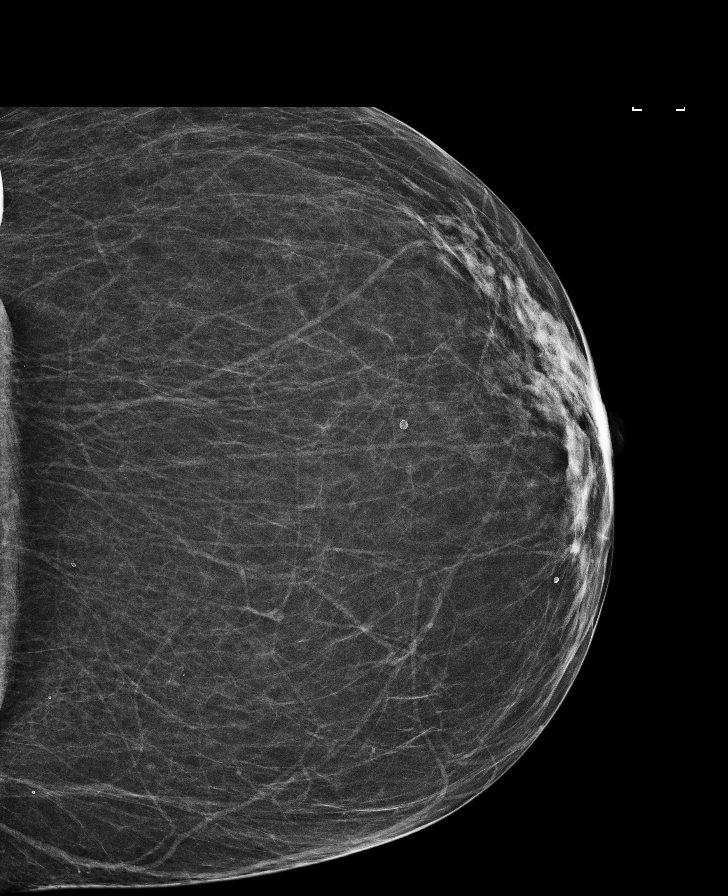

[R CC (1 of 2)]
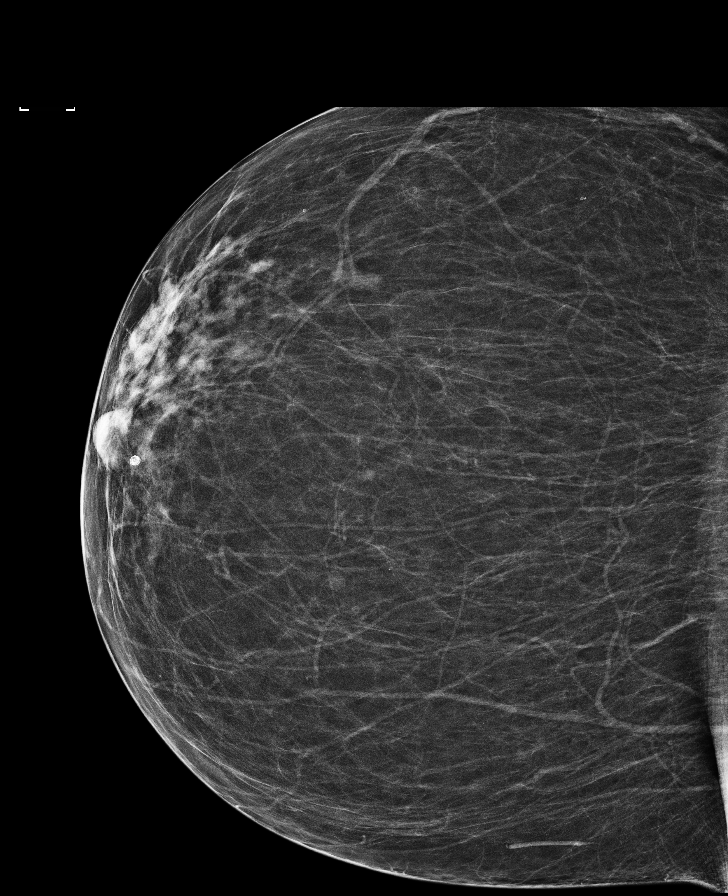

[R CC (2 of 2)]
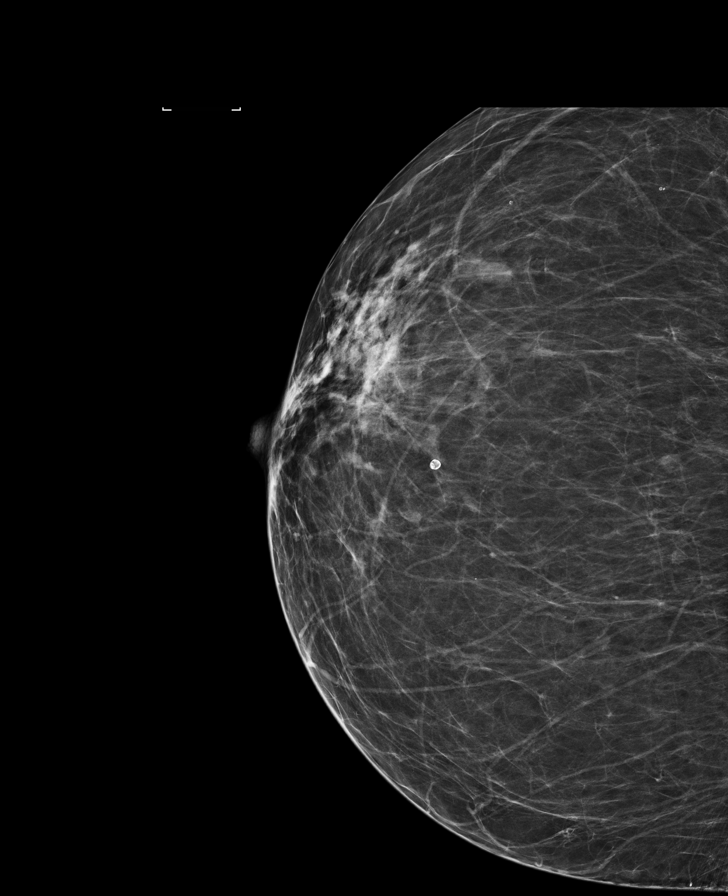

[L MLO (1 of 2)]
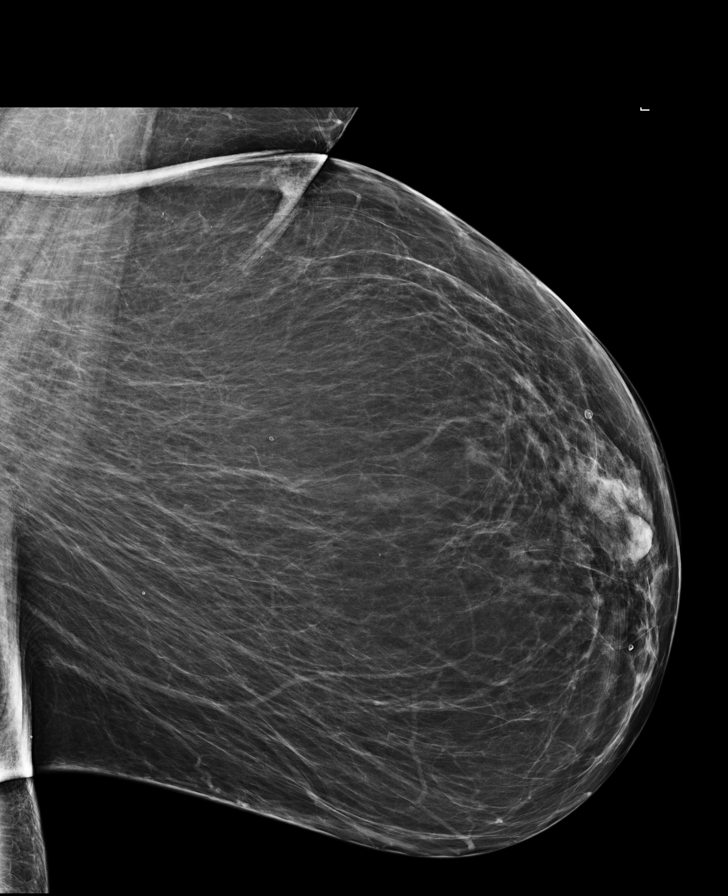

[L MLO (2 of 2)]
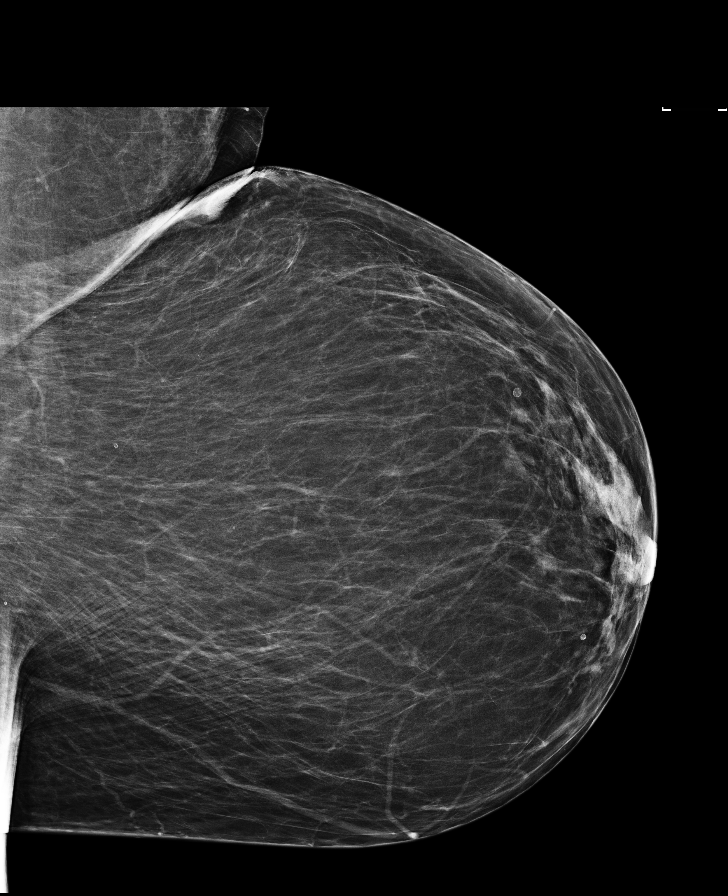

[7 of 7 positions shown; findings below may reference images not displayed]

ACR Breast Density Category b: There are scattered areas of
fibroglandular density.
FINDINGS: There are no findings suspicious for malignancy. The images were
evaluated with computer-aided detection.
IMPRESSION: No mammographic evidence of malignancy. A result letter of this
screening mammogram will be mailed directly to the patient.

RECOMMENDATION:
Screening mammogram in one year. (Code:[id number])

BI-RADS CATEGORY  1: Negative.

## 2022-07-24 ENCOUNTER — Other Ambulatory Visit: Payer: Self-pay

## 2022-07-24 ENCOUNTER — Ambulatory Visit (HOSPITAL_COMMUNITY)
Admission: EM | Admit: 2022-07-24 | Discharge: 2022-07-24 | Disposition: A | Discharge status: Home / Self Care | Payer: BC Managed Care – PPO | Attending: Emergency Medicine | Admitting: Emergency Medicine

## 2022-07-24 DIAGNOSIS — S39012A Strain of muscle, fascia and tendon of lower back, initial encounter: Secondary | ICD-10-CM | POA: Diagnosis not present

## 2022-07-24 MED ORDER — KETOROLAC TROMETHAMINE 30 MG/ML IJ SOLN
INTRAMUSCULAR | Status: AC
  Filled 2022-07-24: qty 1

## 2022-07-24 MED ORDER — KETOROLAC TROMETHAMINE 30 MG/ML IJ SOLN
30.0000 mg | Freq: Once | INTRAMUSCULAR | Status: AC
  Administered 2022-07-24: 30 mg via INTRAMUSCULAR

## 2022-07-24 MED ORDER — CYCLOBENZAPRINE HCL 5 MG PO TABS: 5.0000 mg | ORAL_TABLET | Freq: Every evening | ORAL | 0 refills | Status: AC
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
## 2022-07-24 NOTE — Discharge Instructions (Addendum)
We have given you a Toradol injection today. This should improve your back pain for the next few hours.  You can use tylenol as needed every 6 hours.  You can take the muscle relaxer at bedtime as needed. This medication can make you drowsy. Please be cautions and take only at bedtime - do not use with alcohol and do not drive or work after taking this medicine.  Please follow up with your orthopedic specialist if symptoms persist. With any worsening symptoms, go to the emergency department.   Your work note is on the last page.
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
